# Patient Record
Sex: Male | Born: 1995 | Race: Black or African American | Hispanic: No | Marital: Single
Health system: Southern US, Community
[De-identification: ages and names within clinical notes are randomized; demographics above are authoritative.]

---

## 2018-02-27 ENCOUNTER — Emergency Department (HOSPITAL_COMMUNITY): Payer: Self-pay

## 2018-02-27 ENCOUNTER — Other Ambulatory Visit: Payer: Self-pay

## 2018-02-27 ENCOUNTER — Encounter (HOSPITAL_COMMUNITY): Payer: Self-pay | Admitting: Nurse Practitioner

## 2018-02-27 ENCOUNTER — Emergency Department (HOSPITAL_COMMUNITY)
Admission: EM | Admit: 2018-02-27 | Discharge: 2018-02-27 | Disposition: A | Discharge status: Home / Self Care | Payer: Self-pay | Attending: Emergency Medicine | Admitting: Emergency Medicine

## 2018-02-27 DIAGNOSIS — M25511 Pain in right shoulder: Secondary | ICD-10-CM | POA: Insufficient documentation

## 2018-02-27 MED ORDER — NAPROXEN 500 MG PO TABS: 500.0000 mg | ORAL_TABLET | Freq: Two times a day (BID) | ORAL | 0 refills | Status: AC

## 2018-02-27 MED ORDER — NAPROXEN 500 MG PO TABS
500.0000 mg | ORAL_TABLET | Freq: Once | ORAL | Status: AC
  Administered 2018-02-27: 500 mg via ORAL
  Filled 2018-02-27: qty 1

## 2018-02-27 NOTE — ED Provider Notes (Signed)
[facility]-EMERGENCY DEPT Provider Note   CSN: [id number] Arrival date & time: 02/27/18  2100     History   Chief Complaint Chief Complaint  Patient presents with  . Shoulder Pain    Right    HPI Derek Gray is a 22 y.o. male.  Derek Gray is a 22 y.o. Male who is otherwise healthy, presents to the emergency department for evaluation of right shoulder pain.  He reports just prior to arrival he was trying to avoid stepping on his girlfriends cat and fell landing directly on the right shoulder.  He reports since then he is felt like he has had swelling over the anterior portion of the shoulder and it has been painful especially when reaching upwards and forwards.  Has not noticed any obvious deformities to the shoulder.  No numbness tingling or weakness in the arm.  Did not injure anything else during the fall did not hit his head, no loss of consciousness.  Not taken anything for pain prior to arrival, no other aggravating or alleviating factors.  No prior history of surgery or injury to the shoulder.  Does do a lot of heavy weightlifting regularly.     History reviewed. No pertinent past medical history.  There are no active problems to display for this patient.   History reviewed. No pertinent surgical history.      Home Medications    Prior to Admission medications   Medication Sig Start Date End Date Taking? Authorizing Provider  naproxen (NAPROSYN) 500 MG tablet Take 1 tablet (500 mg total) by mouth 2 (two) times daily. 02/27/18   Dartha Lodge, PA-C    Family History No family history on file.  Social History Social History   Tobacco Use  . Smoking status: Not on file  Substance Use Topics  . Alcohol use: Not on file  . Drug use: Not on file     Allergies   Patient has no known allergies.   Review of Systems Review of Systems  Constitutional: Negative for chills and fever.  Musculoskeletal: Positive for arthralgias and  joint swelling.  Skin: Negative for color change, rash and wound.  Neurological: Negative for weakness and numbness.     Physical Exam Updated Vital Signs BP (!) 148/58   Pulse (!) 57   Temp 98.5 F (36.9 C)   Resp 18   Ht 5\' 9"  (1.753 m)   Wt 73 kg   SpO2 100%   BMI 23.78 kg/m   Physical Exam  Constitutional: He appears well-developed and well-nourished. No distress.  HENT:  Head: Normocephalic and atraumatic.  Eyes: Right eye exhibits no discharge. Left eye exhibits no discharge.  Pulmonary/Chest: Effort normal. No respiratory distress.  Musculoskeletal:  Tenderness to palpation over the anterior aspect of the right shoulder without palpable deformity soft tissue swelling, no erythema or warmth, no ecchymosis.  Range of motion intact with some discomfort, no pain at the elbow wrist.  2+ radial pulse and good capillary refill, normal sensation, 5/5 strength  Neurological: He is alert. Coordination normal.  Skin: Skin is warm and dry. Capillary refill takes less than 2 seconds. He is not diaphoretic.  Psychiatric: He has a normal mood and affect. His behavior is normal.  Nursing note and vitals reviewed.    ED Treatments / Results  Labs (all labs ordered are listed, but only abnormal results are displayed) Labs Reviewed - No data to display  EKG None  Radiology Dg Shoulder Right  Result  Date: 02/27/2018 CLINICAL DATA:  Initial evaluation for acute trauma, fall. EXAM: RIGHT SHOULDER - 2+ VIEW COMPARISON:  None. FINDINGS: There is no evidence of fracture or dislocation. There is no evidence of arthropathy or other focal bone abnormality. Soft tissues are unremarkable. IMPRESSION: Negative. Electronically Signed   By: Rise Mu M.D.   On: 02/27/2018 21:47    Procedures Procedures (including critical care time)  Medications Ordered in ED Medications  naproxen (NAPROSYN) tablet 500 mg (has no administration in time range)     Initial Impression /  Assessment and Plan / ED Course  I have reviewed the triage vital signs and the nursing notes.  Pertinent labs & imaging results that were available during my care of the patient were reviewed by me and considered in my medical decision making (see chart for details).  Patient X-Ray negative for obvious fracture or dislocation. Pain managed in ED. Pt advised to follow up with orthopedics if symptoms persist for further evaluation. Patient given sling while in ED, conservative therapy recommended and discussed. Patient will be dc home & is agreeable with above plan.   Final Clinical Impressions(s) / ED Diagnoses   Final diagnoses:  Acute pain of right shoulder    ED Discharge Orders         Ordered    naproxen (NAPROSYN) 500 MG tablet  2 times daily     02/27/18 2225           Legrand Rams 02/27/18 2235    Azalia Bilis, MD 02/27/18 2329

## 2018-02-27 NOTE — Discharge Instructions (Signed)
Your x-ray shows no evidence of fracture dislocation.  You can use sling as needed for comfort, naproxen twice daily for pain, it is safe to combine this with Tylenol.  You can also use ice and heat over the area.  If symptoms persist please follow-up with orthopedics.  Return to the emergency department for significantly worsened pain in the shoulder, numbness tingling or weakness in the arm or any other new or concerning symptoms.

## 2018-02-27 NOTE — ED Triage Notes (Signed)
Pt is c/o right shoulder pain, states he fell as he tried to avoid stepping on his girlfriends cat.

## 2019-04-22 IMAGING — CR DG SHOULDER 2+V*R*
3 series · 3 of 3 positions shown · non-contrast
Comparison: None.

CLINICAL DATA: Initial evaluation for acute trauma, fall.

EXAM:
RIGHT SHOULDER - 2+ VIEW

[w shoulder external right]
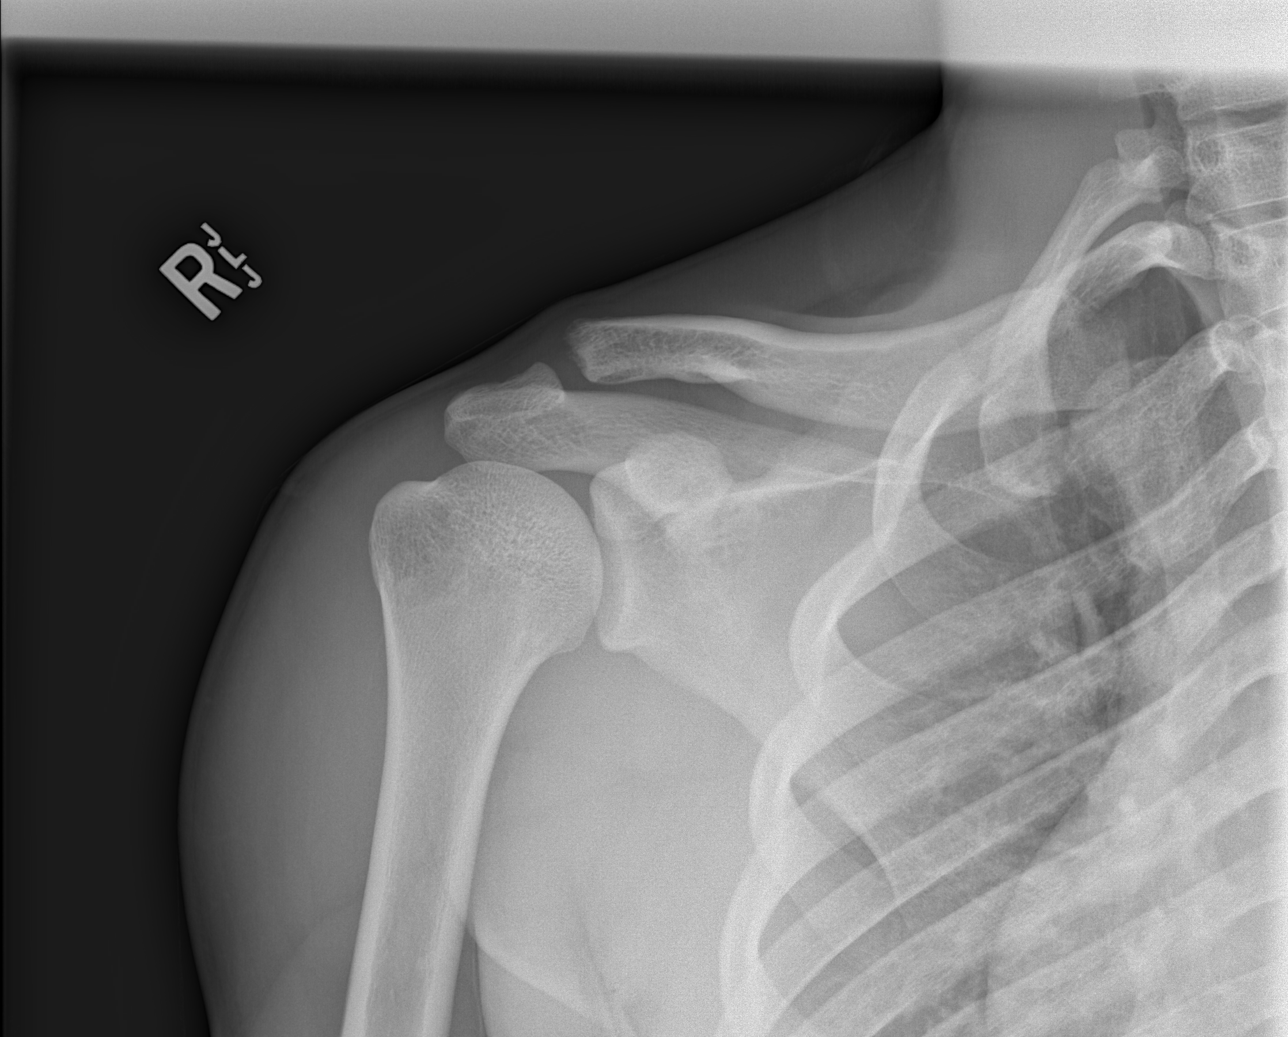

[w shoulder y-view right]
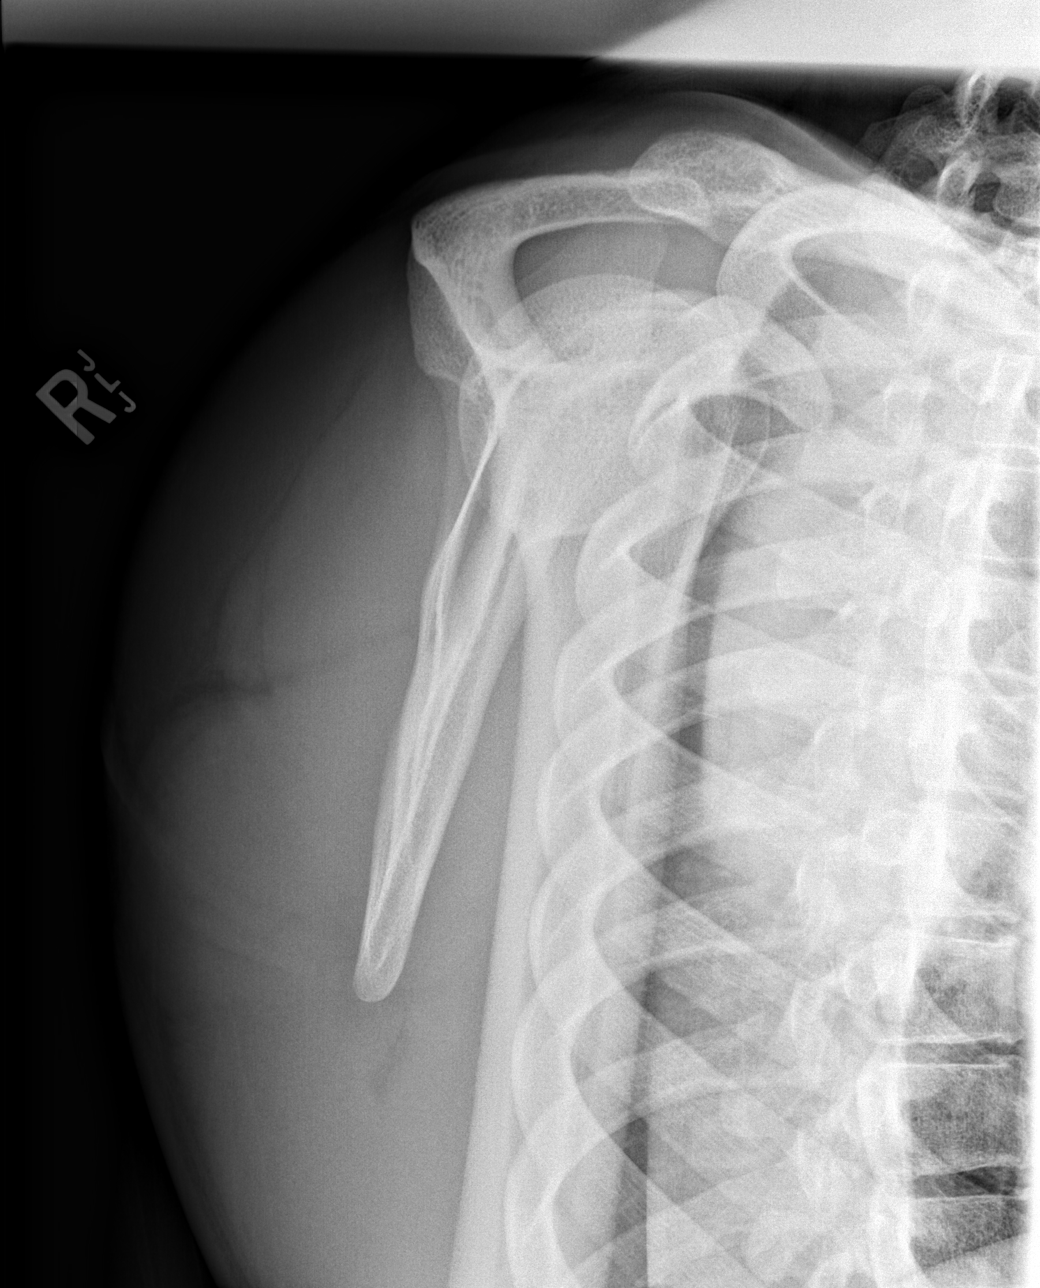

[x shoulder axillary right]
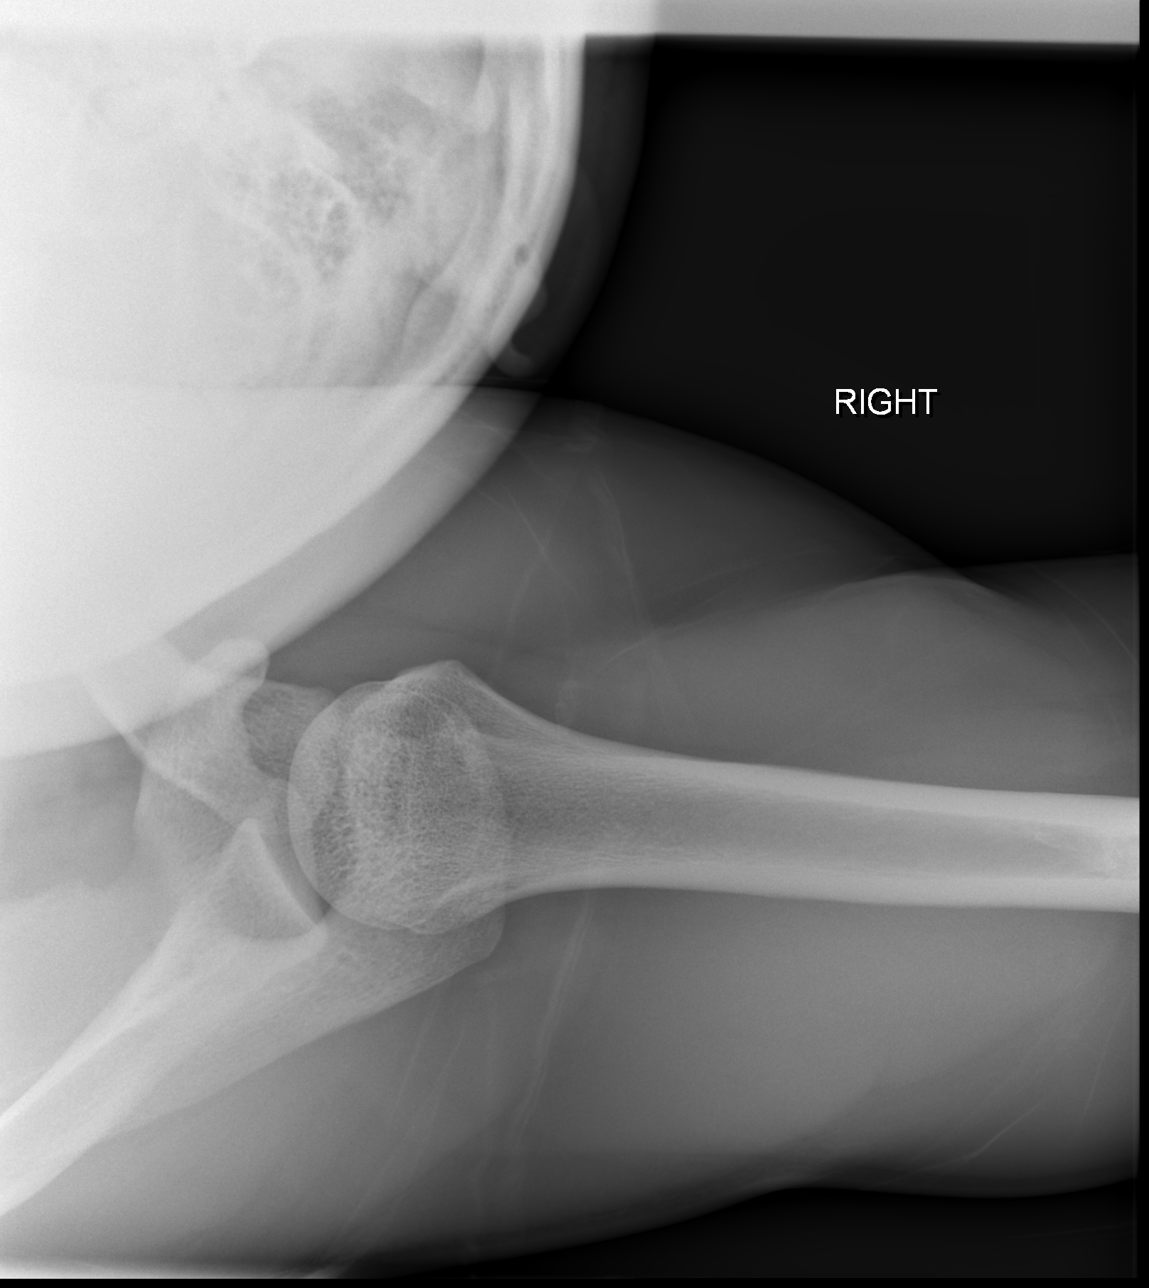

[3 of 3 positions shown; findings below may reference images not displayed]

FINDINGS: There is no evidence of fracture or dislocation. There is no
evidence of arthropathy or other focal bone abnormality. Soft
tissues are unremarkable.
IMPRESSION: Negative.
